# Patient Record
Sex: Female | Born: 1975 | Race: White | Hispanic: No | Marital: Married | Smoking: Never smoker
Health system: Southern US, Community
[De-identification: ages and names within clinical notes are randomized; demographics above are authoritative.]

## PROBLEM LIST (undated history)

## (undated) ENCOUNTER — Inpatient Hospital Stay (HOSPITAL_COMMUNITY): Payer: Self-pay

## (undated) DIAGNOSIS — Z789 Other specified health status: Secondary | ICD-10-CM

## (undated) DIAGNOSIS — F419 Anxiety disorder, unspecified: Secondary | ICD-10-CM

## (undated) HISTORY — PX: NO PAST SURGERIES: SHX2092

## (undated) HISTORY — PX: BREAST ENHANCEMENT SURGERY: SHX7

## (undated) HISTORY — PX: WISDOM TOOTH EXTRACTION: SHX21

---

## 2003-05-25 ENCOUNTER — Other Ambulatory Visit: Admission: RE | Admit: 2003-05-25 | Discharge: 2003-05-25 | Payer: Self-pay | Admitting: Obstetrics and Gynecology

## 2004-08-19 ENCOUNTER — Other Ambulatory Visit: Admission: RE | Admit: 2004-08-19 | Discharge: 2004-08-19 | Payer: Self-pay | Admitting: Obstetrics and Gynecology

## 2005-09-01 ENCOUNTER — Other Ambulatory Visit: Admission: RE | Admit: 2005-09-01 | Discharge: 2005-09-01 | Payer: Self-pay | Admitting: Obstetrics and Gynecology

## 2006-09-14 ENCOUNTER — Ambulatory Visit (HOSPITAL_COMMUNITY): Admission: RE | Admit: 2006-09-14 | Discharge: 2006-09-14 | Payer: Self-pay | Admitting: Obstetrics and Gynecology

## 2006-10-28 ENCOUNTER — Ambulatory Visit (HOSPITAL_COMMUNITY): Admission: RE | Admit: 2006-10-28 | Discharge: 2006-10-28 | Payer: Self-pay | Admitting: Obstetrics and Gynecology

## 2007-01-31 ENCOUNTER — Inpatient Hospital Stay (HOSPITAL_COMMUNITY): Admission: AD | Admit: 2007-01-31 | Discharge: 2007-02-03 | Payer: Self-pay | Admitting: Obstetrics and Gynecology

## 2007-11-17 IMAGING — US US OB DETAIL+14 WK
1 series · 14 of 28 positions shown · non-contrast
Comparison: none

OBSTETRICAL ULTRASOUND:
 This ultrasound was performed in The [HOSPITAL], and the [facility] report will be stored to [REDACTED] PACS.

[Series 1: us ob detail+14 wk · 14 of 103 slices shown]
[im 4/103]
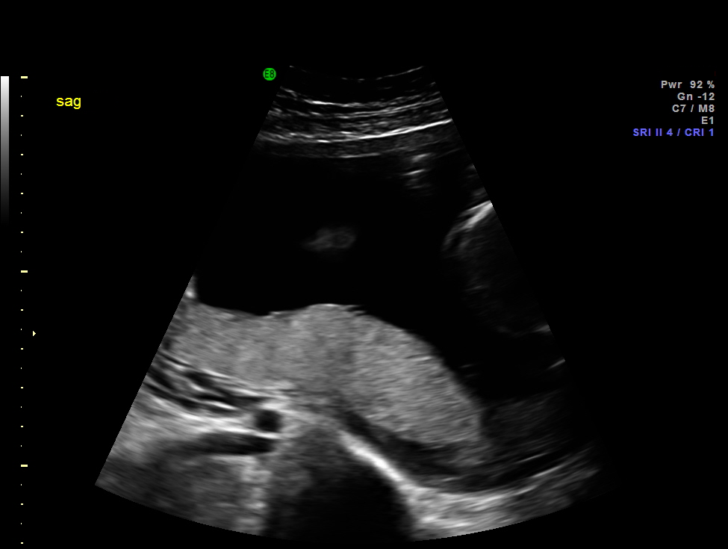
[im 12/103]
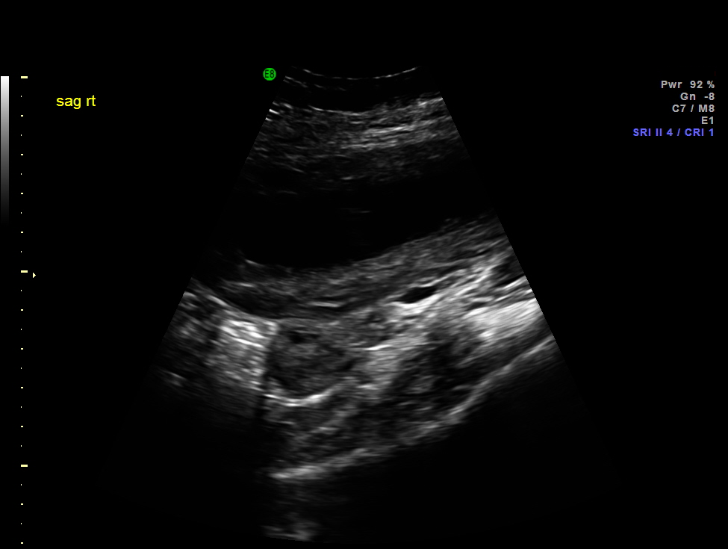
[im 19/103]
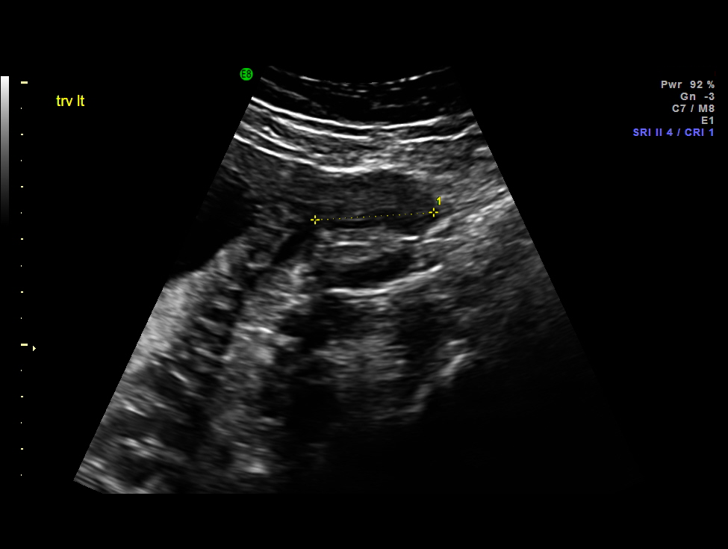
[im 27/103]
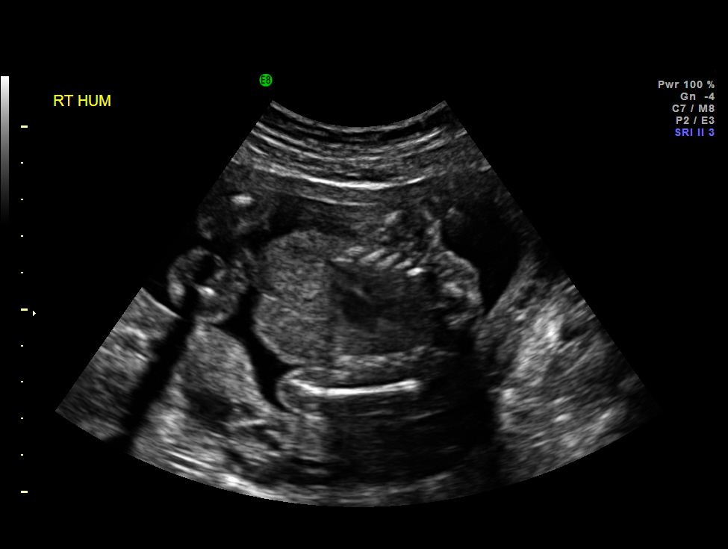
[im 35/103]
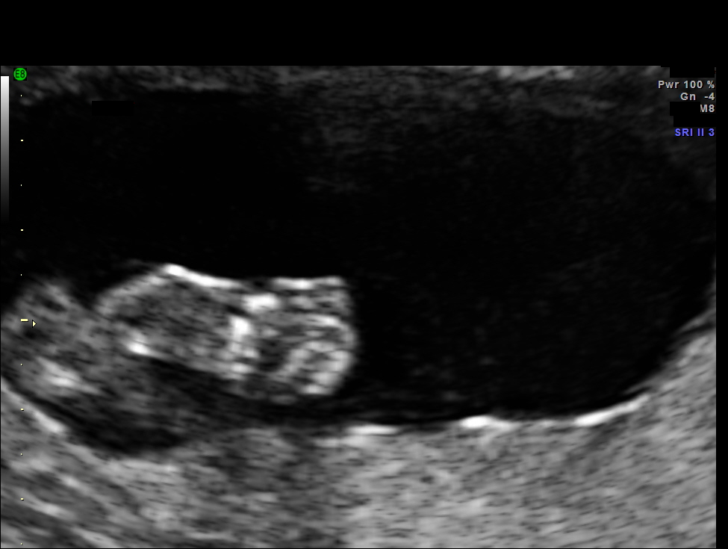
[im 42/103]
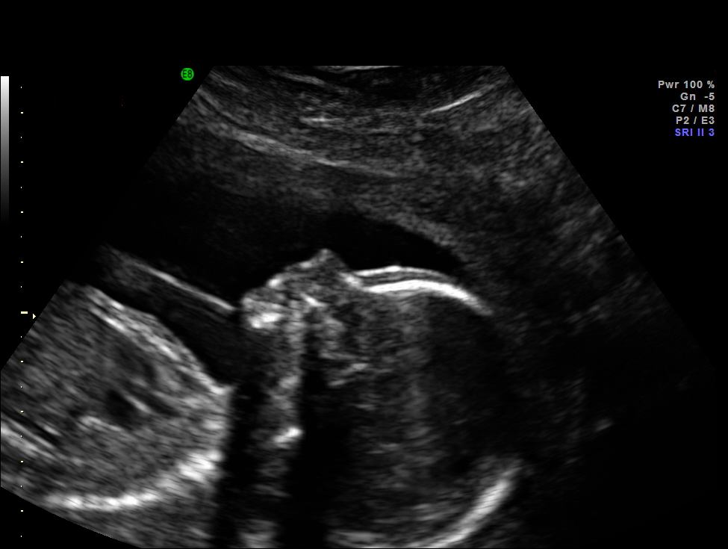
[im 50/103]
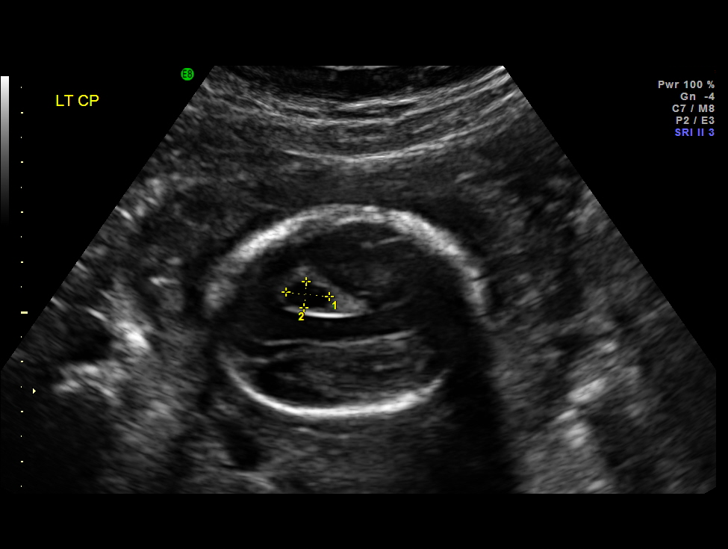
[im 57/103]
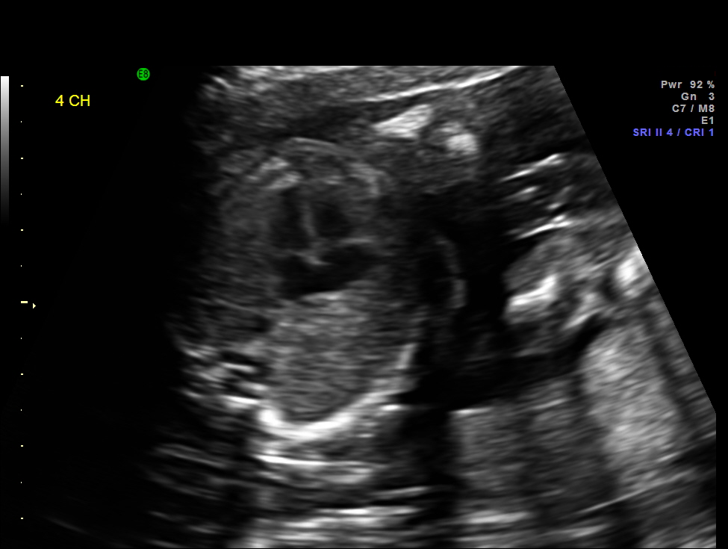
[im 65/103]
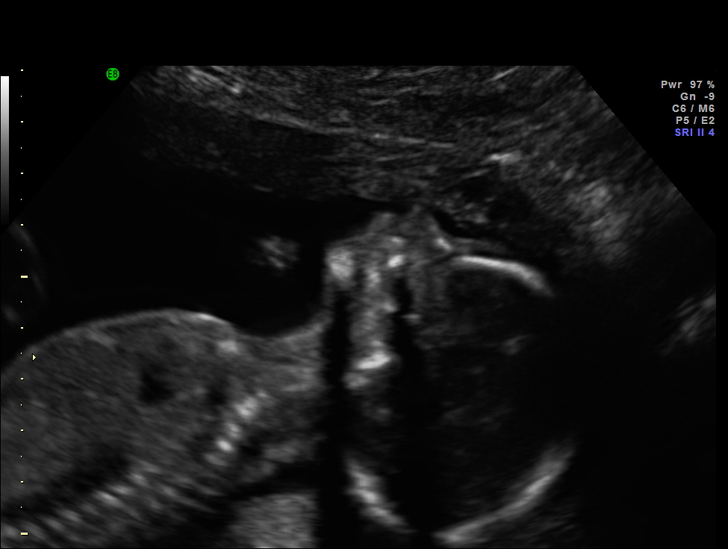
[im 72/103]
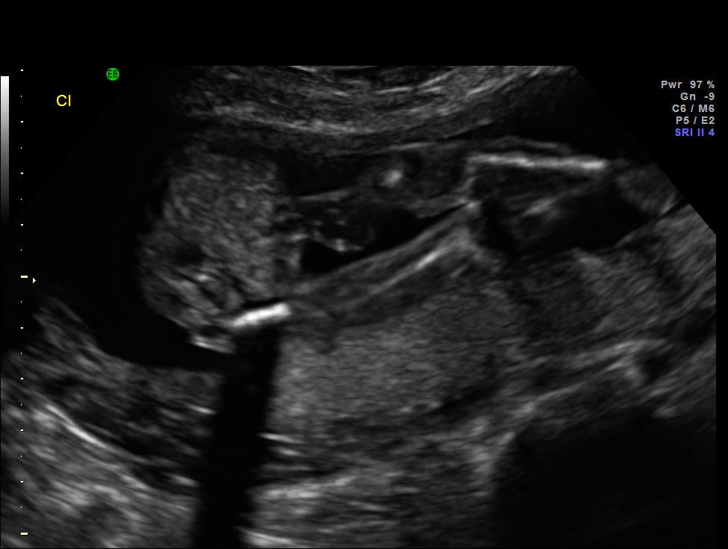
[im 80/103]
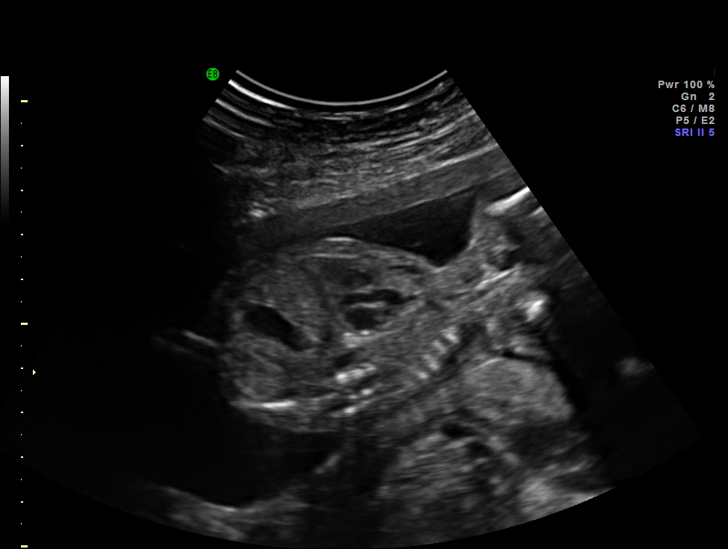
[im 87/103]
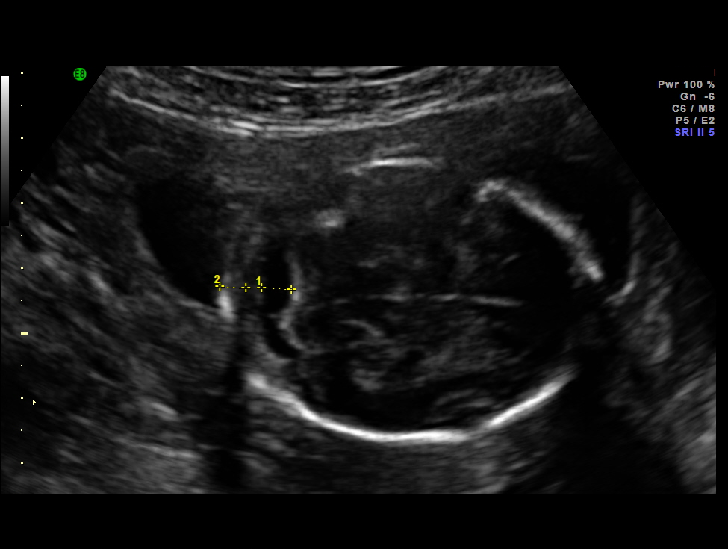
[im 95/103]
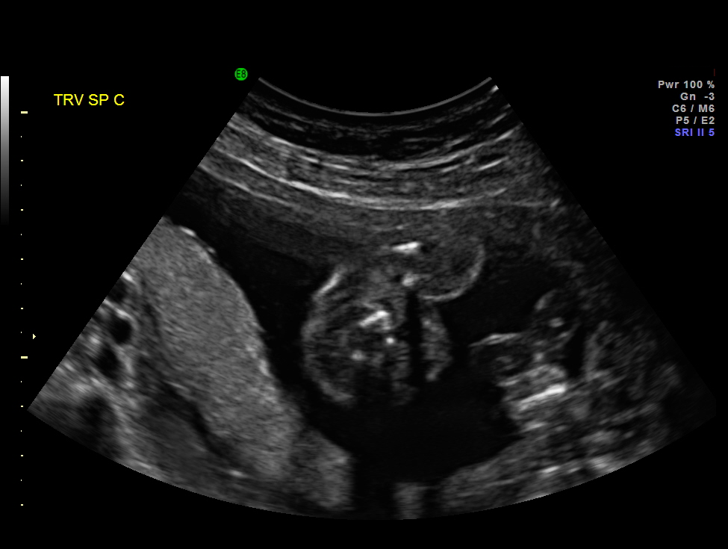
[im 103/103]
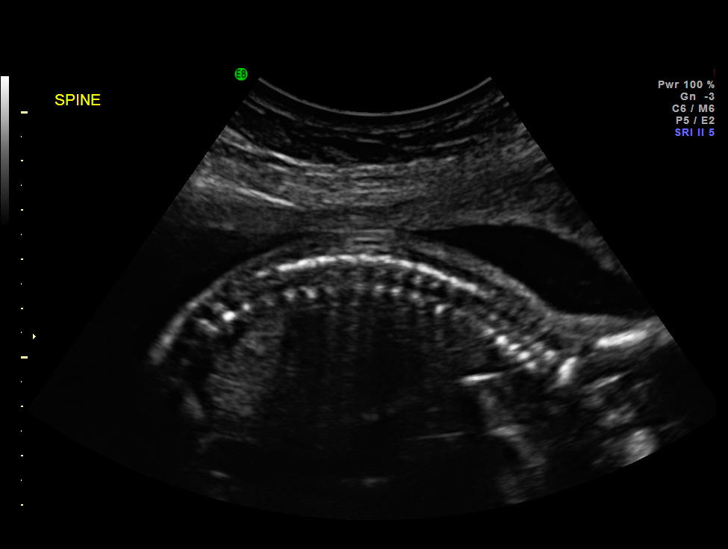

[14 of 28 positions shown; findings below may reference images not displayed]

IMPRESSION: The [facility] report has also been faxed to the ordering physician.

## 2010-03-09 ENCOUNTER — Inpatient Hospital Stay (HOSPITAL_COMMUNITY): Admission: AD | Admit: 2010-03-09 | Discharge: 2010-03-09 | Payer: Self-pay | Admitting: Obstetrics and Gynecology

## 2010-03-10 ENCOUNTER — Inpatient Hospital Stay (HOSPITAL_COMMUNITY): Admission: AD | Admit: 2010-03-10 | Discharge: 2010-03-10 | Payer: Self-pay | Admitting: Obstetrics and Gynecology

## 2010-03-11 ENCOUNTER — Inpatient Hospital Stay (HOSPITAL_COMMUNITY): Admission: RE | Admit: 2010-03-11 | Discharge: 2010-03-13 | Payer: Self-pay | Admitting: Obstetrics and Gynecology

## 2010-12-14 LAB — CBC
MCHC: 34.6 g/dL (ref 30.0–36.0)
RBC: 3.59 MIL/uL — ABNORMAL LOW (ref 3.87–5.11)
RDW: 14.4 % (ref 11.5–15.5)

## 2010-12-14 LAB — RH IMMUNE GLOB WKUP(>/=20WKS)(NOT WOMEN'S HOSP): Fetal Screen: NEGATIVE

## 2010-12-15 LAB — CBC
HCT: 32.7 % — ABNORMAL LOW (ref 36.0–46.0)
Hemoglobin: 11.5 g/dL — ABNORMAL LOW (ref 12.0–15.0)
MCHC: 35.1 g/dL (ref 30.0–36.0)
RDW: 14 % (ref 11.5–15.5)

## 2012-12-01 ENCOUNTER — Ambulatory Visit
Admission: RE | Admit: 2012-12-01 | Discharge: 2012-12-01 | Disposition: A | Discharge status: Home / Self Care | Payer: Managed Care, Other (non HMO) | Source: Ambulatory Visit | Attending: Family Medicine | Admitting: Family Medicine

## 2012-12-01 ENCOUNTER — Other Ambulatory Visit: Payer: Self-pay | Admitting: Family Medicine

## 2014-02-03 IMAGING — CR DG HIP COMPLETE 2+V*R*
2 series · 2 of 2 positions shown · non-contrast
Comparison: None.

CLINICAL DATA: Right hip pain for 3 weeks

RIGHT HIP - COMPLETE 2+ VIEW

[view not recorded (1 of 2)]
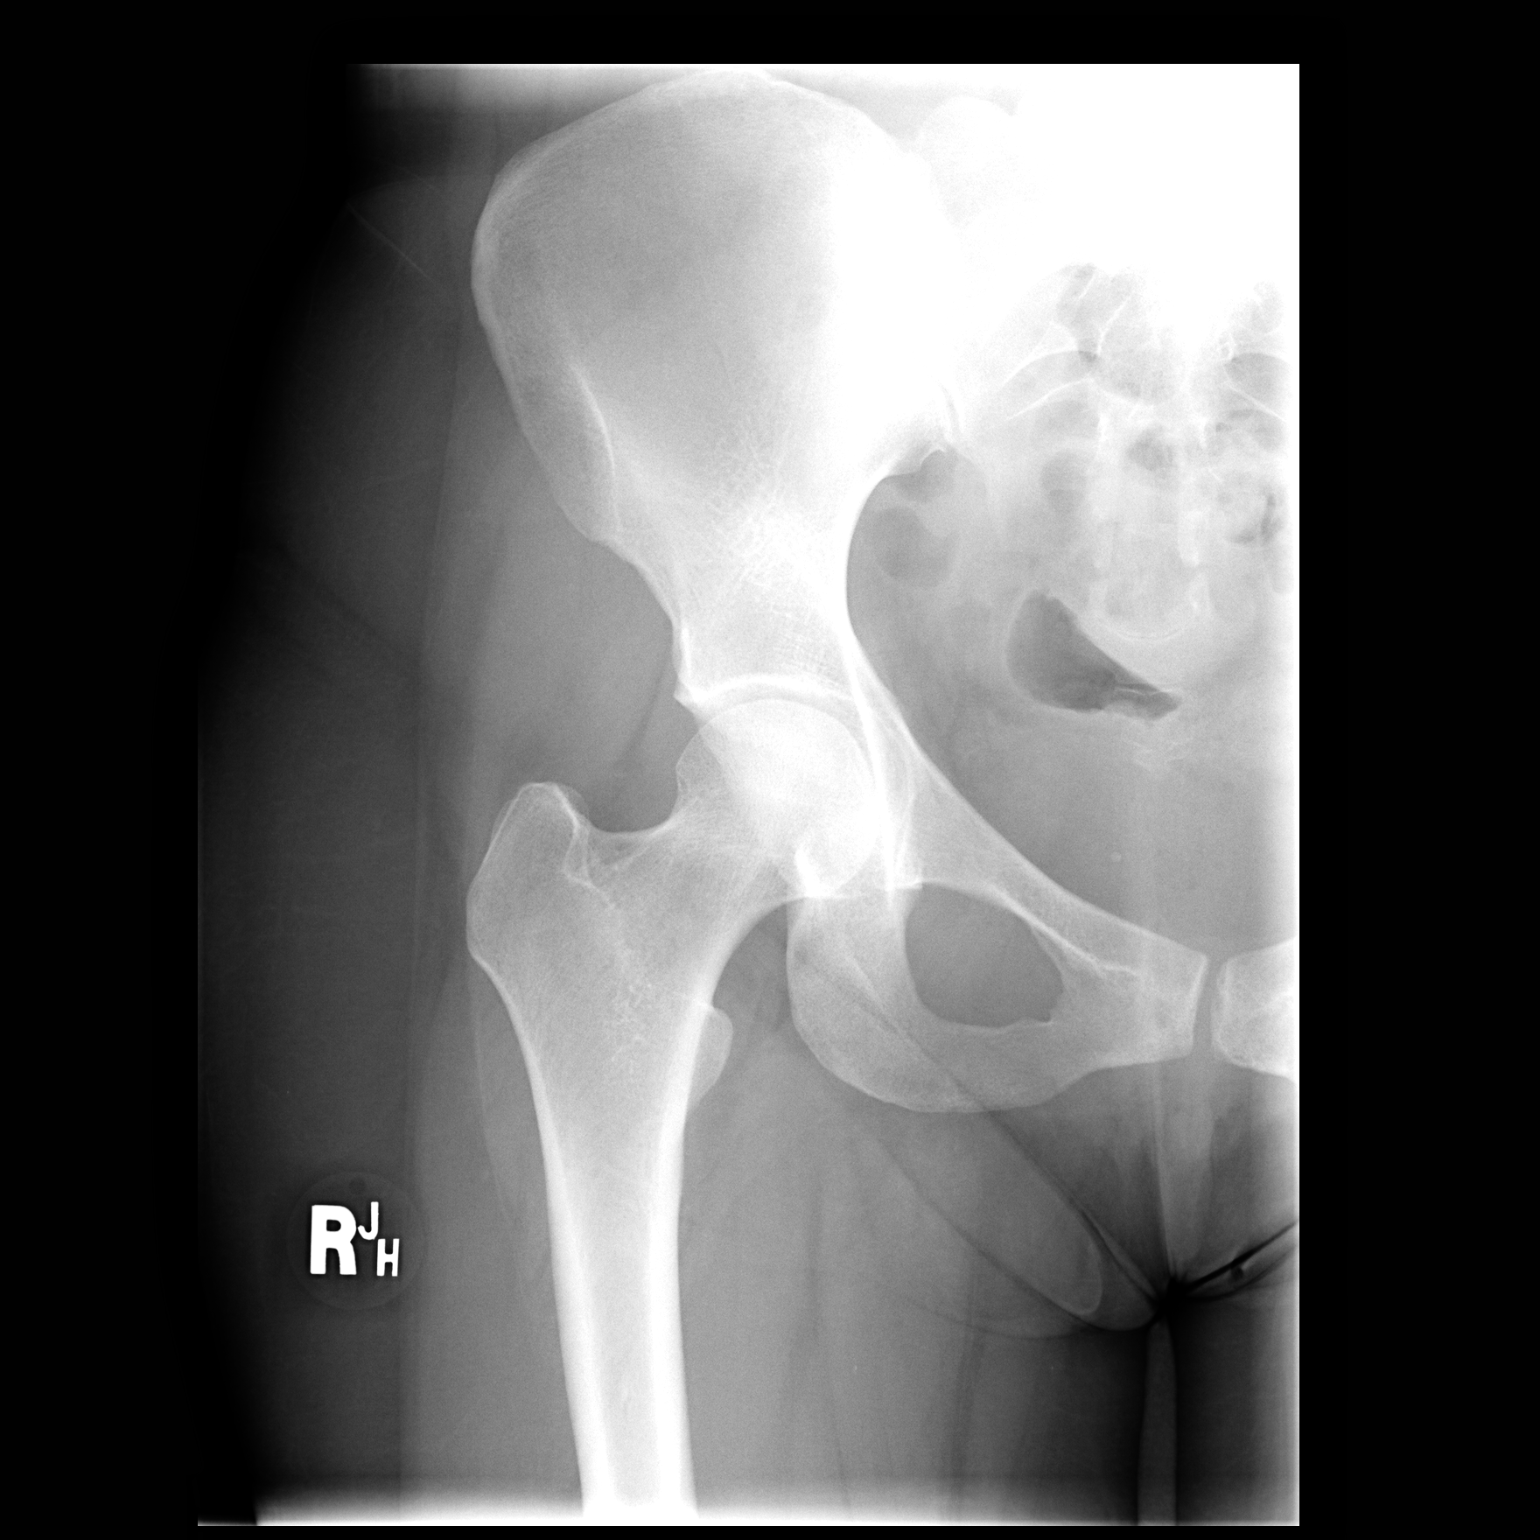

[view not recorded (2 of 2)]
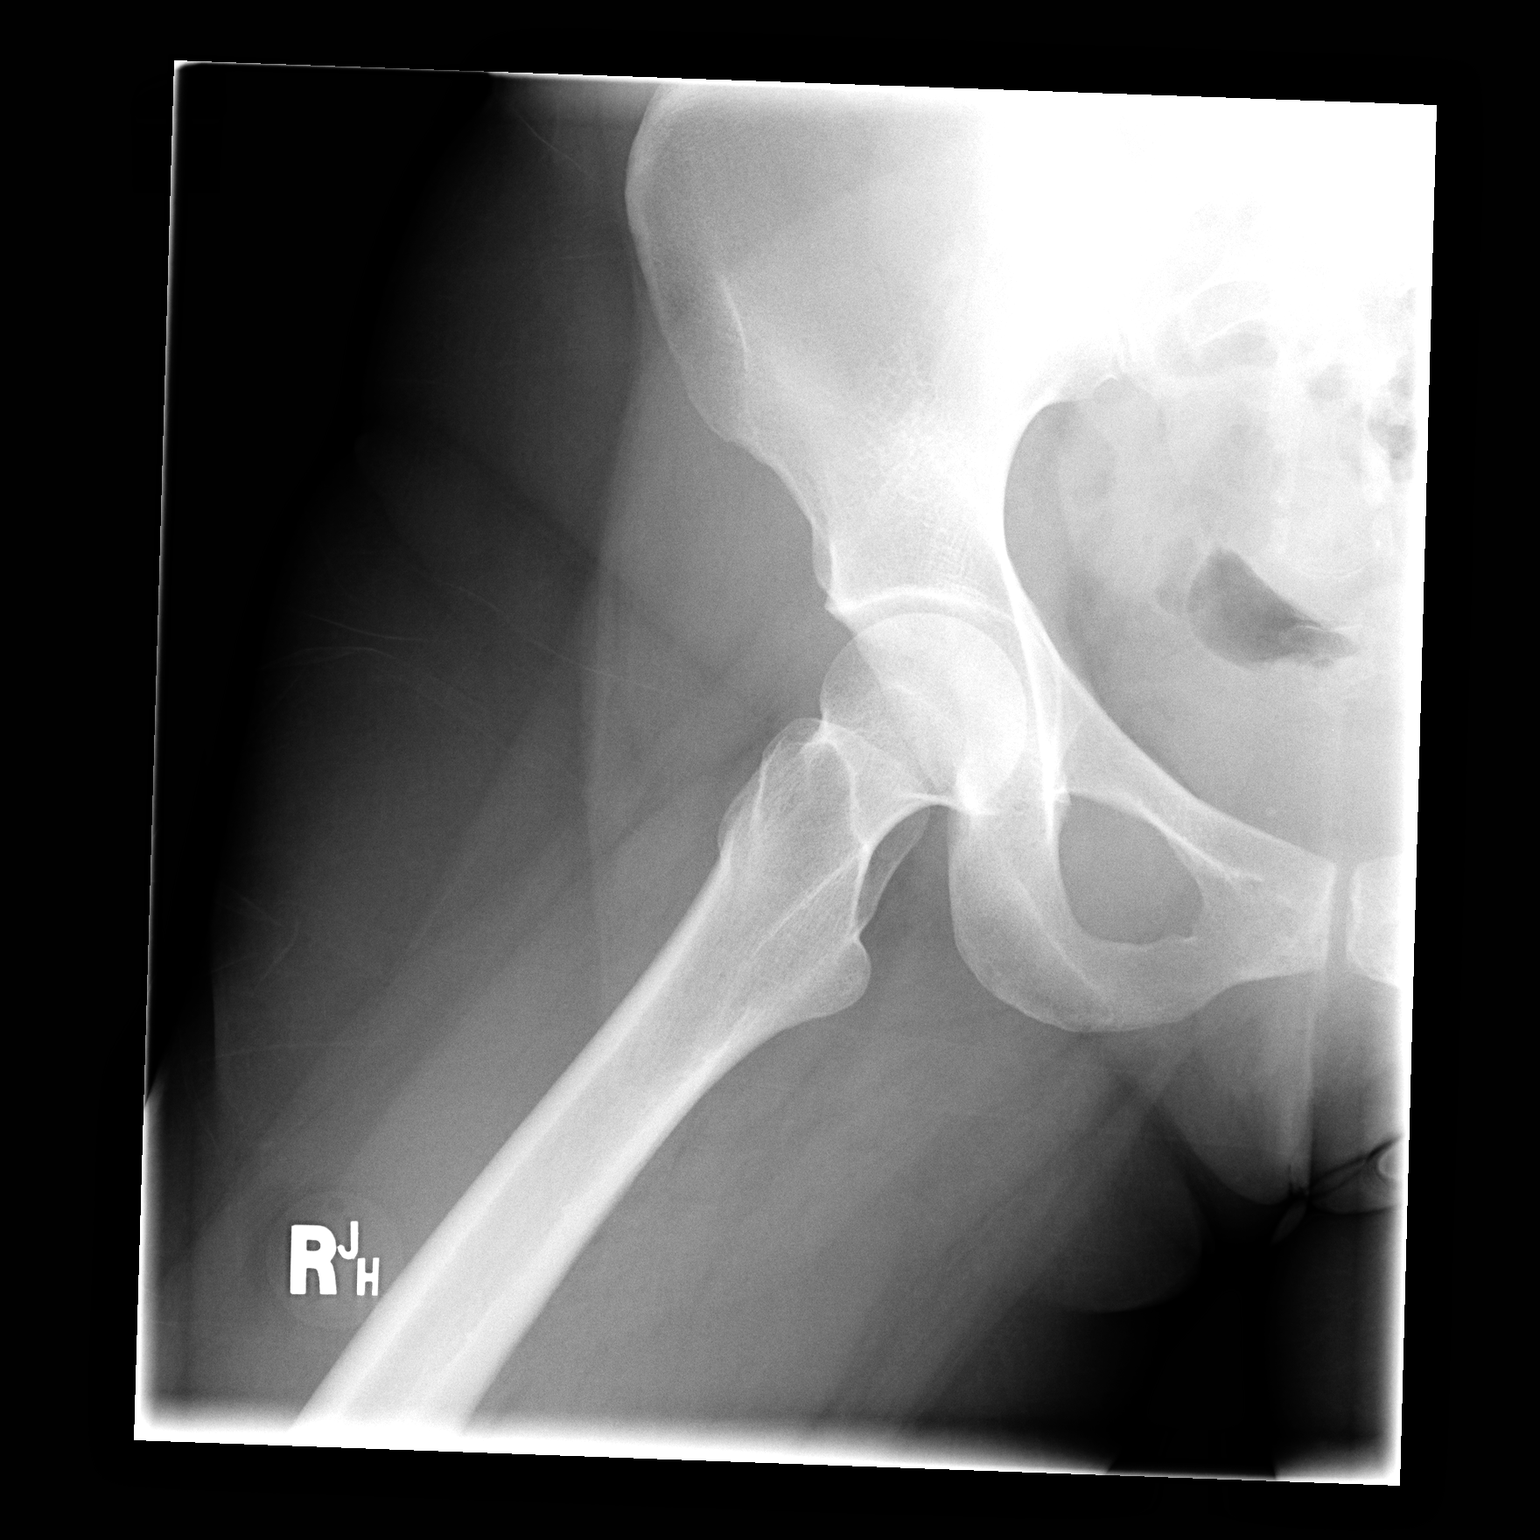

[2 of 2 positions shown; findings below may reference images not displayed]

FINDINGS: Two views the right hip show normal hip joint space.  No
fracture is seen.  The right ramus is intact.  The right SI joint
appears corticated.
IMPRESSION: Negative.

## 2014-07-11 LAB — OB RESULTS CONSOLE ABO/RH: RH Type: NEGATIVE

## 2014-07-11 LAB — OB RESULTS CONSOLE HEPATITIS B SURFACE ANTIGEN: Hepatitis B Surface Ag: NEGATIVE

## 2014-07-11 LAB — OB RESULTS CONSOLE GC/CHLAMYDIA
Chlamydia: NEGATIVE
Gonorrhea: NEGATIVE

## 2014-07-11 LAB — OB RESULTS CONSOLE ANTIBODY SCREEN: ANTIBODY SCREEN: NEGATIVE

## 2014-07-11 LAB — OB RESULTS CONSOLE HIV ANTIBODY (ROUTINE TESTING): HIV: NONREACTIVE

## 2014-07-11 LAB — OB RESULTS CONSOLE RUBELLA ANTIBODY, IGM: Rubella: NON-IMMUNE/NOT IMMUNE

## 2014-07-11 LAB — OB RESULTS CONSOLE RPR: RPR: NONREACTIVE

## 2014-09-28 NOTE — L&D Delivery Note (Signed)
Delivery Note At   a viable female was delivered via NSVD OA Presentation Apgars 9 9 weight pending Placenta status:spontaneously with 3 vessel cord , .  Cord:  with the following complications:tight nuchal cord x 1 .  Cord pH: not obtained  Anesthesia:  epidural Episiotomy: none Lacerations: none  Suture Repair: not applicable Est. Blood Loss (mL):  200  Mom to postpartum.  Baby to Couplet care / Skin to Skin.  Arriona Prest L 02/04/2015, 3:11 AM

## 2014-12-28 ENCOUNTER — Encounter (HOSPITAL_COMMUNITY): Payer: Self-pay

## 2014-12-28 ENCOUNTER — Inpatient Hospital Stay (HOSPITAL_COMMUNITY)
Admission: AD | Admit: 2014-12-28 | Discharge: 2014-12-28 | Disposition: A | Discharge status: Home / Self Care | Payer: Managed Care, Other (non HMO) | Source: Ambulatory Visit | Attending: Obstetrics and Gynecology | Admitting: Obstetrics and Gynecology

## 2014-12-28 DIAGNOSIS — O479 False labor, unspecified: Secondary | ICD-10-CM | POA: Diagnosis not present

## 2014-12-28 DIAGNOSIS — Z3A33 33 weeks gestation of pregnancy: Secondary | ICD-10-CM | POA: Diagnosis not present

## 2014-12-28 HISTORY — DX: Other specified health status: Z78.9

## 2014-12-28 LAB — URINE MICROSCOPIC-ADD ON

## 2014-12-28 LAB — URINALYSIS, ROUTINE W REFLEX MICROSCOPIC
Bilirubin Urine: NEGATIVE
Glucose, UA: NEGATIVE mg/dL
Ketones, ur: 15 mg/dL — AB
Leukocytes, UA: NEGATIVE
NITRITE: NEGATIVE
PROTEIN: NEGATIVE mg/dL
SPECIFIC GRAVITY, URINE: 1.01 (ref 1.005–1.030)
UROBILINOGEN UA: 0.2 mg/dL (ref 0.0–1.0)
pH: 7 (ref 5.0–8.0)

## 2014-12-28 MED ORDER — TERBUTALINE SULFATE 1 MG/ML IJ SOLN
0.2500 mg | Freq: Once | INTRAMUSCULAR | Status: AC
  Administered 2014-12-28: 0.25 mg via SUBCUTANEOUS
  Filled 2014-12-28: qty 1

## 2014-12-28 NOTE — Discharge Instructions (Signed)
Braxton Hicks Contractions °Contractions of the uterus can occur throughout pregnancy. Contractions are not always a sign that you are in labor.  °WHAT ARE BRAXTON HICKS CONTRACTIONS?  °Contractions that occur before labor are called Braxton Hicks contractions, or false labor. Toward the end of pregnancy (32-34 weeks), these contractions can develop more often and may become more forceful. This is not true labor because these contractions do not result in opening (dilatation) and thinning of the cervix. They are sometimes difficult to tell apart from true labor because these contractions can be forceful and people have different pain tolerances. You should not feel embarrassed if you go to the hospital with false labor. Sometimes, the only way to tell if you are in true labor is for your health care provider to look for changes in the cervix. °If there are no prenatal problems or other health problems associated with the pregnancy, it is completely safe to be sent home with false labor and await the onset of true labor. °HOW CAN YOU TELL THE DIFFERENCE BETWEEN TRUE AND FALSE LABOR? °False Labor °· The contractions of false labor are usually shorter and not as hard as those of true labor.   °· The contractions are usually irregular.   °· The contractions are often felt in the front of the lower abdomen and in the groin.   °· The contractions may go away when you walk around or change positions while lying down.   °· The contractions get weaker and are shorter lasting as time goes on.   °· The contractions do not usually become progressively stronger, regular, and closer together as with true labor.   °True Labor °· Contractions in true labor last 30-70 seconds, become very regular, usually become more intense, and increase in frequency.   °· The contractions do not go away with walking.   °· The discomfort is usually felt in the top of the uterus and spreads to the lower abdomen and low back.   °· True labor can be  determined by your health care provider with an exam. This will show that the cervix is dilating and getting thinner.   °WHAT TO REMEMBER °· Keep up with your usual exercises and follow other instructions given by your health care provider.   °· Take medicines as directed by your health care provider.   °· Keep your regular prenatal appointments.   °· Eat and drink lightly if you think you are going into labor.   °· If Braxton Hicks contractions are making you uncomfortable:   °¨ Change your position from lying down or resting to walking, or from walking to resting.   °¨ Sit and rest in a tub of warm water.   °¨ Drink 2-3 glasses of water. Dehydration may cause these contractions.   °¨ Do slow and deep breathing several times an hour.   °WHEN SHOULD I SEEK IMMEDIATE MEDICAL CARE? °Seek immediate medical care if: °· Your contractions become stronger, more regular, and closer together.   °· You have fluid leaking or gushing from your vagina.   °· You have a fever.   °· You pass blood-tinged mucus.   °· You have vaginal bleeding.   °· You have continuous abdominal pain.   °· You have low back pain that you never had before.   °· You feel your baby's head pushing down and causing pelvic pressure.   °· Your baby is not moving as much as it used to.   °Document Released: 09/14/2005 Document Revised: 09/19/2013 Document Reviewed: 06/26/2013 °ExitCare® Patient Information ©2015 [employer]. This information is not intended to replace advice given to you by your health care   provider. Make sure you discuss any questions you have with your health care provider. ° °

## 2014-12-28 NOTE — MAU Provider Note (Signed)
  History     CSN: [id number]  Arrival date and time: 12/28/14 1635   First Provider Initiated Contact with Patient 12/28/14 1840      Chief Complaint  Patient presents with  . Contractions   HPI Kristy Charles 39 y.o. G3P2 @ [id number] reports after noticing a nickel size amt of brown mucus-like discharge when she wiped earlier today after using the bathroom.  She denies contractions, LOF, vaginal bleeding, dysuria, weakness, fever, headache.  She is feeling pressure.     OB History    Gravida Para Term Preterm AB TAB SAB Ectopic Multiple Living   1               No past medical history on file.  No past surgical history on file.  No family history on file.  History  Substance Use Topics  . Smoking status: Not on file  . Smokeless tobacco: Not on file  . Alcohol Use: Not on file    Allergies:  Allergies  Allergen Reactions  . Augmentin [Amoxicillin-Pot Clavulanate] Hives    Prescriptions prior to admission  Medication Sig Dispense Refill Last Dose  . OVER THE COUNTER MEDICATION Take 2 tablets by mouth as needed (fiber tabs as needed for constipation).   Past Week at Unknown time  . Prenatal Vit-Fe Fumarate-FA (PRENATAL MULTIVITAMIN) TABS tablet Take 1 tablet by mouth daily at 12 noon.   12/27/2014 at Unknown time    ROS Pertinent ROS in HPI  Physical Exam   There were no vitals taken for this visit.  Physical Exam  Constitutional: She is oriented to person, place, and time. She appears well-developed and well-nourished. No distress.  HENT:  Head: Normocephalic and atraumatic.  Eyes: EOM are normal.  Neck: Normal range of motion.  Cardiovascular: Normal rate and regular rhythm.   Respiratory: Effort normal and breath sounds normal. No respiratory distress.  GI: Soft. Bowel sounds are normal. There is no tenderness.  Genitourinary:  Small amt of homogenous discharge with tan-colored tent.   Cervix is 1cm, long.  No tenderness  Musculoskeletal: Normal range  of motion.  Neurological: She is alert and oriented to person, place, and time.  Skin: Skin is warm and dry.  Psychiatric: She has a normal mood and affect.   Fetal Tracing: Baseline:150 Variability:mod Accelerations: 15x15 Decelerations:none Toco:irreg q 4-6   MAU Course  Procedures Pt encouraged to push po fluids.  Cervix rechecked after one hour and no change detected.   Despite good po fluid intake, no improvement in ctxs.  She also now notes feeling the contractions.    MDM Dr. Marcelle OverlieHolland consulted.  He advises for Terbutyline 0.25mg  SQ x 1.  Following this, pt may be discharge to home with relaxing weekend.  Any contractions - return to MAU.  Report given and care turned over to Cathie BeamsFran Cresenzo-Dishmon, CNM.    Nada MaclachlanKaren Teague Clark, PA-C 9:26pm  Assessment and Plan    Pt had adequate response to terbutaline and feels "much better".  Pt discharged home

## 2015-01-10 LAB — OB RESULTS CONSOLE GBS: STREP GROUP B AG: NEGATIVE

## 2015-02-03 ENCOUNTER — Inpatient Hospital Stay (HOSPITAL_COMMUNITY)
Admission: AD | Admit: 2015-02-03 | Discharge: 2015-02-05 | DRG: 775 | Disposition: A | Discharge status: Home / Self Care | Payer: Managed Care, Other (non HMO) | Source: Ambulatory Visit | Attending: Obstetrics and Gynecology | Admitting: Obstetrics and Gynecology

## 2015-02-03 ENCOUNTER — Encounter (HOSPITAL_COMMUNITY): Payer: Self-pay | Admitting: *Deleted

## 2015-02-03 ENCOUNTER — Inpatient Hospital Stay (HOSPITAL_COMMUNITY): Payer: Managed Care, Other (non HMO) | Admitting: Anesthesiology

## 2015-02-03 DIAGNOSIS — O4292 Full-term premature rupture of membranes, unspecified as to length of time between rupture and onset of labor: Principal | ICD-10-CM | POA: Diagnosis present

## 2015-02-03 DIAGNOSIS — F410 Panic disorder [episodic paroxysmal anxiety] without agoraphobia: Secondary | ICD-10-CM | POA: Diagnosis present

## 2015-02-03 DIAGNOSIS — Z23 Encounter for immunization: Secondary | ICD-10-CM

## 2015-02-03 DIAGNOSIS — O99344 Other mental disorders complicating childbirth: Secondary | ICD-10-CM | POA: Diagnosis present

## 2015-02-03 DIAGNOSIS — Z3A39 39 weeks gestation of pregnancy: Secondary | ICD-10-CM | POA: Diagnosis present

## 2015-02-03 HISTORY — DX: Anxiety disorder, unspecified: F41.9

## 2015-02-03 LAB — CBC
HCT: 35.4 % — ABNORMAL LOW (ref 36.0–46.0)
Hemoglobin: 12.1 g/dL (ref 12.0–15.0)
MCH: 30.5 pg (ref 26.0–34.0)
MCHC: 34.2 g/dL (ref 30.0–36.0)
MCV: 89.2 fL (ref 78.0–100.0)
PLATELETS: 185 10*3/uL (ref 150–400)
RBC: 3.97 MIL/uL (ref 3.87–5.11)
RDW: 13.6 % (ref 11.5–15.5)
WBC: 12.3 10*3/uL — ABNORMAL HIGH (ref 4.0–10.5)

## 2015-02-03 LAB — POCT FERN TEST

## 2015-02-03 MED ORDER — OXYTOCIN BOLUS FROM INFUSION
500.0000 mL | INTRAVENOUS | Status: DC
  Administered 2015-02-04: 500 mL via INTRAVENOUS

## 2015-02-03 MED ORDER — TERBUTALINE SULFATE 1 MG/ML IJ SOLN: 0.2500 mg | Freq: Once | INTRAMUSCULAR | Status: AC | PRN

## 2015-02-03 MED ORDER — FENTANYL 2.5 MCG/ML BUPIVACAINE 1/10 % EPIDURAL INFUSION (WH - ANES)
14.0000 mL/h | INTRAMUSCULAR | Status: DC | PRN
  Administered 2015-02-03: 12 mL/h via EPIDURAL
  Administered 2015-02-03: 14 mL/h via EPIDURAL
  Filled 2015-02-03: qty 125

## 2015-02-03 MED ORDER — LIDOCAINE HCL (PF) 1 % IJ SOLN
30.0000 mL | INTRAMUSCULAR | Status: DC | PRN
  Filled 2015-02-03: qty 30

## 2015-02-03 MED ORDER — DIPHENHYDRAMINE HCL 50 MG/ML IJ SOLN: 12.5000 mg | INTRAMUSCULAR | Status: DC | PRN

## 2015-02-03 MED ORDER — ONDANSETRON HCL 4 MG/2ML IJ SOLN: 4.0000 mg | Freq: Four times a day (QID) | INTRAMUSCULAR | Status: DC | PRN

## 2015-02-03 MED ORDER — OXYTOCIN 40 UNITS IN LACTATED RINGERS INFUSION - SIMPLE MED
1.0000 m[IU]/min | INTRAVENOUS | Status: DC
  Administered 2015-02-03: 2 m[IU]/min via INTRAVENOUS

## 2015-02-03 MED ORDER — PHENYLEPHRINE 40 MCG/ML (10ML) SYRINGE FOR IV PUSH (FOR BLOOD PRESSURE SUPPORT)
80.0000 ug | PREFILLED_SYRINGE | INTRAVENOUS | Status: DC | PRN
  Filled 2015-02-03: qty 2
  Filled 2015-02-03: qty 20

## 2015-02-03 MED ORDER — FLEET ENEMA 7-19 GM/118ML RE ENEM: 1.0000 | ENEMA | RECTAL | Status: DC | PRN

## 2015-02-03 MED ORDER — ACETAMINOPHEN 325 MG PO TABS: 650.0000 mg | ORAL_TABLET | ORAL | Status: DC | PRN

## 2015-02-03 MED ORDER — OXYCODONE-ACETAMINOPHEN 5-325 MG PO TABS: 1.0000 | ORAL_TABLET | ORAL | Status: DC | PRN

## 2015-02-03 MED ORDER — LACTATED RINGERS IV SOLN
INTRAVENOUS | Status: DC
  Administered 2015-02-03: 500 mL via INTRAVENOUS
  Administered 2015-02-03 – 2015-02-04 (×2): via INTRAVENOUS

## 2015-02-03 MED ORDER — EPHEDRINE 5 MG/ML INJ
10.0000 mg | INTRAVENOUS | Status: DC | PRN
  Filled 2015-02-03: qty 2

## 2015-02-03 MED ORDER — OXYCODONE-ACETAMINOPHEN 5-325 MG PO TABS: 2.0000 | ORAL_TABLET | ORAL | Status: DC | PRN

## 2015-02-03 MED ORDER — LIDOCAINE-EPINEPHRINE (PF) 2 %-1:200000 IJ SOLN
INTRAMUSCULAR | Status: DC | PRN
  Administered 2015-02-03: 3 mL

## 2015-02-03 MED ORDER — LACTATED RINGERS IV SOLN
500.0000 mL | INTRAVENOUS | Status: DC | PRN
  Administered 2015-02-04: 1000 mL via INTRAVENOUS

## 2015-02-03 MED ORDER — BUPIVACAINE HCL (PF) 0.25 % IJ SOLN
INTRAMUSCULAR | Status: DC | PRN
  Administered 2015-02-03 (×2): 3 mL

## 2015-02-03 MED ORDER — OXYTOCIN 40 UNITS IN LACTATED RINGERS INFUSION - SIMPLE MED
62.5000 mL/h | INTRAVENOUS | Status: DC
  Filled 2015-02-03: qty 1000

## 2015-02-03 MED ORDER — CITRIC ACID-SODIUM CITRATE 334-500 MG/5ML PO SOLN: 30.0000 mL | ORAL | Status: DC | PRN

## 2015-02-03 NOTE — Anesthesia Preprocedure Evaluation (Signed)
Anesthesia Evaluation  Patient identified by MRN, date of birth, ID band Patient awake    Reviewed: Allergy & Precautions, NPO status , Patient's Chart, lab work & pertinent test results  History of Anesthesia Complications Negative for: history of anesthetic complications  Airway Mallampati: II  TM Distance: >3 FB Neck ROM: Full    Dental  (+) Teeth Intact   Pulmonary neg pulmonary ROS,  breath sounds clear to auscultation        Cardiovascular negative cardio ROS  Rhythm:Regular     Neuro/Psych PSYCHIATRIC DISORDERS Anxiety negative neurological ROS     GI/Hepatic negative GI ROS, Neg liver ROS,   Endo/Other  negative endocrine ROS  Renal/GU negative Renal ROS     Musculoskeletal   Abdominal   Peds  Hematology  (+) anemia ,   Anesthesia Other Findings   Reproductive/Obstetrics (+) Pregnancy                             Anesthesia Physical Anesthesia Plan  ASA: II  Anesthesia Plan: Epidural   Post-op Pain Management:    Induction:   Airway Management Planned:   Additional Equipment:   Intra-op Plan:   Post-operative Plan:   Informed Consent: I have reviewed the patients History and Physical, chart, labs and discussed the procedure including the risks, benefits and alternatives for the proposed anesthesia with the patient or authorized representative who has indicated his/her understanding and acceptance.     Plan Discussed with: Anesthesiologist  Anesthesia Plan Comments:         Anesthesia Quick Evaluation

## 2015-02-03 NOTE — Anesthesia Procedure Notes (Signed)
Epidural Patient location during procedure: OB  Staffing Anesthesiologist: Lyrica Mcclarty, CHRIS Performed by: anesthesiologist   Preanesthetic Checklist Completed: patient identified, surgical consent, pre-op evaluation, timeout performed, IV checked, risks and benefits discussed and monitors and equipment checked  Epidural Patient position: sitting Prep: site prepped and draped and DuraPrep Patient monitoring: heart rate, cardiac monitor, continuous pulse ox and blood pressure Approach: midline Location: L3-L4 Injection technique: LOR saline  Needle:  Needle type: Tuohy  Needle gauge: 17 G Needle length: 9 cm Needle insertion depth: 6 cm Catheter type: closed end flexible Catheter size: 19 Gauge Catheter at skin depth: 12 cm Test dose: negative and 2% lidocaine with Epi 1:200 K  Assessment Events: blood not aspirated, injection not painful, no injection resistance, negative IV test and no paresthesia  Additional Notes H+P and labs checked, risks and benefits discussed with the patient, consent obtained, procedure tolerated well and without complications.  Reason for block:procedure for pain   

## 2015-02-04 ENCOUNTER — Encounter (HOSPITAL_COMMUNITY): Payer: Self-pay | Admitting: General Practice

## 2015-02-04 LAB — CBC
HCT: 32.9 % — ABNORMAL LOW (ref 36.0–46.0)
Hemoglobin: 11.1 g/dL — ABNORMAL LOW (ref 12.0–15.0)
MCH: 30 pg (ref 26.0–34.0)
MCHC: 33.7 g/dL (ref 30.0–36.0)
MCV: 88.9 fL (ref 78.0–100.0)
Platelets: 165 10*3/uL (ref 150–400)
RBC: 3.7 MIL/uL — ABNORMAL LOW (ref 3.87–5.11)
RDW: 13.5 % (ref 11.5–15.5)
WBC: 18.6 10*3/uL — ABNORMAL HIGH (ref 4.0–10.5)

## 2015-02-04 LAB — RPR: RPR Ser Ql: NONREACTIVE

## 2015-02-04 MED ORDER — DIPHENHYDRAMINE HCL 25 MG PO CAPS: 25.0000 mg | ORAL_CAPSULE | Freq: Four times a day (QID) | ORAL | Status: DC | PRN

## 2015-02-04 MED ORDER — FLEET ENEMA 7-19 GM/118ML RE ENEM: 1.0000 | ENEMA | Freq: Every day | RECTAL | Status: DC | PRN

## 2015-02-04 MED ORDER — SENNOSIDES-DOCUSATE SODIUM 8.6-50 MG PO TABS
2.0000 | ORAL_TABLET | ORAL | Status: DC
Start: 2015-02-05 — End: 2015-02-05
  Administered 2015-02-04: 2 via ORAL
  Filled 2015-02-04: qty 2

## 2015-02-04 MED ORDER — MEDROXYPROGESTERONE ACETATE 150 MG/ML IM SUSP: 150.0000 mg | INTRAMUSCULAR | Status: DC | PRN

## 2015-02-04 MED ORDER — ZOLPIDEM TARTRATE 5 MG PO TABS: 5.0000 mg | ORAL_TABLET | Freq: Every evening | ORAL | Status: DC | PRN

## 2015-02-04 MED ORDER — TETANUS-DIPHTH-ACELL PERTUSSIS 5-2.5-18.5 LF-MCG/0.5 IM SUSP: 0.5000 mL | Freq: Once | INTRAMUSCULAR | Status: DC

## 2015-02-04 MED ORDER — ACETAMINOPHEN 325 MG PO TABS: 650.0000 mg | ORAL_TABLET | ORAL | Status: DC | PRN

## 2015-02-04 MED ORDER — SIMETHICONE 80 MG PO CHEW: 80.0000 mg | CHEWABLE_TABLET | ORAL | Status: DC | PRN

## 2015-02-04 MED ORDER — BISACODYL 10 MG RE SUPP: 10.0000 mg | Freq: Every day | RECTAL | Status: DC | PRN

## 2015-02-04 MED ORDER — LANOLIN HYDROUS EX OINT: TOPICAL_OINTMENT | CUTANEOUS | Status: DC | PRN

## 2015-02-04 MED ORDER — OXYCODONE-ACETAMINOPHEN 5-325 MG PO TABS: 1.0000 | ORAL_TABLET | ORAL | Status: DC | PRN

## 2015-02-04 MED ORDER — IBUPROFEN 600 MG PO TABS
600.0000 mg | ORAL_TABLET | Freq: Four times a day (QID) | ORAL | Status: DC
Start: 2015-02-04 — End: 2015-02-05
  Administered 2015-02-04 – 2015-02-05 (×5): 600 mg via ORAL
  Filled 2015-02-04 (×5): qty 1

## 2015-02-04 MED ORDER — BENZOCAINE-MENTHOL 20-0.5 % EX AERO
1.0000 "application " | INHALATION_SPRAY | CUTANEOUS | Status: DC | PRN
  Filled 2015-02-04: qty 56

## 2015-02-04 MED ORDER — PRENATAL MULTIVITAMIN CH
1.0000 | ORAL_TABLET | Freq: Every day | ORAL | Status: DC
  Administered 2015-02-04: 1 via ORAL
  Filled 2015-02-04: qty 1

## 2015-02-04 MED ORDER — WITCH HAZEL-GLYCERIN EX PADS: 1.0000 "application " | MEDICATED_PAD | CUTANEOUS | Status: DC | PRN

## 2015-02-04 MED ORDER — DIBUCAINE 1 % RE OINT: 1.0000 "application " | TOPICAL_OINTMENT | RECTAL | Status: DC | PRN

## 2015-02-04 MED ORDER — ONDANSETRON HCL 4 MG/2ML IJ SOLN: 4.0000 mg | INTRAMUSCULAR | Status: DC | PRN

## 2015-02-04 MED ORDER — ONDANSETRON HCL 4 MG PO TABS: 4.0000 mg | ORAL_TABLET | ORAL | Status: DC | PRN

## 2015-02-04 MED ORDER — OXYCODONE-ACETAMINOPHEN 5-325 MG PO TABS: 2.0000 | ORAL_TABLET | ORAL | Status: DC | PRN

## 2015-02-04 MED ORDER — MEASLES, MUMPS & RUBELLA VAC ~~LOC~~ INJ
0.5000 mL | INJECTION | Freq: Once | SUBCUTANEOUS | Status: AC
  Administered 2015-02-05: 0.5 mL via SUBCUTANEOUS
  Filled 2015-02-04 (×2): qty 0.5

## 2015-02-04 NOTE — H&P (Signed)
Kristy Charles is a 39 y.o. female presenting for SROM. Maternal Medical History:  Reason for admission: Rupture of membranes.   Contractions: Onset was 3-5 hours ago.   Frequency: regular.   Perceived severity is moderate.    Fetal activity: Perceived fetal activity is normal.      OB History    Gravida Para Term Preterm AB TAB SAB Ectopic Multiple Living   3 2 2       2      Past Medical History  Diagnosis Date  . Medical history non-contributory   . Anxiety     panic attack   Past Surgical History  Procedure Laterality Date  . No past surgeries    . Wisdom tooth extraction    . Breast enhancement surgery      18 months ago Jan 2015   Family History: family history is not on file. Social History:  reports that she has never smoked. She has never used smokeless tobacco. She reports that she drinks alcohol. She reports that she does not use illicit drugs.   Prenatal Transfer Tool  Maternal Diabetes: No Genetic Screening: Normal Maternal Ultrasounds/Referrals: Normal Fetal Ultrasounds or other Referrals:  None Maternal Substance Abuse:  No Significant Maternal Medications:  None Significant Maternal Lab Results:  None Other Comments:  None  Review of Systems  All other systems reviewed and are negative.   Dilation: Lip/rim Effacement (%): 100 Station: +1 Exam by:: Veronica Mensah Blood pressure 100/63, pulse 96, temperature 98.5 F (36.9 C), temperature source Oral, resp. rate 18, height 5\' 4"  (1.626 m), weight 76.658 kg (169 lb), SpO2 100 %. Maternal Exam:  Uterine Assessment: Contraction strength is moderate.  Contraction frequency is regular.   Abdomen: Fetal presentation: vertex     Fetal Exam Fetal State Assessment: Category I - tracings are normal.     Physical Exam  Nursing note and vitals reviewed. Constitutional: She appears well-developed.  HENT:  Head: Normocephalic.  Eyes: Pupils are equal, round, and reactive to light.  Neck:  Normal range of motion.  Cardiovascular: Normal rate and regular rhythm.   Respiratory: Effort normal.  GI: Soft.    Prenatal labs: ABO, Rh: --/--/O NEG (05/08 2123) Antibody: POS (05/08 2123) Rubella: Nonimmune (10/14 0000) RPR: Nonreactive (10/14 0000)  HBsAg: Negative (10/14 0000)  HIV: Non-reactive (10/14 0000)  GBS: Negative (04/14 0000)   Assessment/Plan: IUP at term SROM Anticipate NSVD  [name] L 02/04/2015, 1:49 AM

## 2015-02-04 NOTE — Lactation Note (Signed)
This note was copied from the chart of Kristy Rose Fillersllison Posadas. Lactation Consultation Note  P3, Ex BF for 8 months.  States she has hand expressed and seen colostrum. Mother had breast augmentation in 2015 with an inframammary incision (below breast). Discussed supply and demand and engorgement and directed her to bfar.org website. Encouraged her to breastfeed often with feeding cues. Reviewed cluster feeding, deep latch. Mom encouraged to feed baby 8-12 times/24 hours.  Mom made aware of O/P services, breastfeeding support groups, community resources, and our phone # for post-discharge questions.  Left LC phone number and encouraged her to call with next feeding.    Patient Name: Kristy Charles Today's Date: 02/04/2015 Reason for consult: Initial assessment   Maternal Data Has patient been taught Hand Expression?: Yes  Feeding Feeding Type: Breast Fed Length of feed: 10 min  LATCH Score/Interventions                      Lactation Tools Discussed/Used     Consult Status Consult Status: Follow-up Date: 02/05/15 Follow-up type: In-patient    Dahlia ByesBerkelhammer, Jaevin Medearis [facility] 02/04/2015, 11:53 AM

## 2015-02-04 NOTE — Anesthesia Postprocedure Evaluation (Signed)
  Anesthesia Post-op Note  Patient: Olena Hecklellison H Drew  Procedure(s) Performed: * No procedures listed *  Patient Location: Mother/Baby  Anesthesia Type:Epidural  Level of Consciousness: awake, alert , oriented and patient cooperative  Airway and Oxygen Therapy: Patient Spontanous Breathing  Post-op Pain: none  Post-op Assessment: Post-op Vital signs reviewed, Patient's Cardiovascular Status Stable, Respiratory Function Stable, Patent Airway, No headache, No backache, No residual numbness and No residual motor weakness  Post-op Vital Signs: Reviewed and stable  Last Vitals:  Filed Vitals:   02/04/15 0711  BP: 104/58  Pulse: 83  Temp: 36.9 C  Resp: 20    Complications: No apparent anesthesia complications

## 2015-02-04 NOTE — Progress Notes (Signed)
Post Partum Day 0 Subjective: no complaints, up ad lib and tolerating PO  Objective: Blood pressure 104/58, pulse 83, temperature 98.4 F (36.9 C), temperature source Oral, resp. rate 20, height 5\' 4"  (1.626 m), weight 169 lb (76.658 kg), SpO2 100 %, unknown if currently breastfeeding.  Physical Exam:  General: alert and cooperative Lochia: appropriate Uterine Fundus: firm Incision: perineum intact DVT Evaluation: No evidence of DVT seen on physical exam. Negative Homan's sign. No cords or calf tenderness. No significant calf/ankle edema.   Recent Labs  02/03/15 2030 02/04/15 0645  HGB 12.1 11.1*  HCT 35.4* 32.9*    Assessment/Plan: Plan for discharge tomorrow and Circumcision prior to discharge   LOS: 1 day   CURTIS,CAROL G 02/04/2015, 8:19 AM

## 2015-02-05 ENCOUNTER — Inpatient Hospital Stay (HOSPITAL_COMMUNITY): Admission: RE | Admit: 2015-02-05 | Payer: Managed Care, Other (non HMO) | Source: Ambulatory Visit

## 2015-02-05 NOTE — Discharge Summary (Signed)
Obstetric Discharge Summary Reason for Admission: rupture of membranes Prenatal Procedures: ultrasound Intrapartum Procedures: spontaneous vaginal delivery Postpartum Procedures: none Complications-Operative and Postpartum: none HEMOGLOBIN  Date Value Ref Range Status  02/04/2015 11.1* 12.0 - 15.0 g/dL Final   HCT  Date Value Ref Range Status  02/04/2015 32.9* 36.0 - 46.0 % Final    Physical Exam:  General: alert and cooperative Lochia: appropriate Uterine Fundus: firm Incision: perineum intact DVT Evaluation: No evidence of DVT seen on physical exam. Negative Homan's sign. No cords or calf tenderness. No significant calf/ankle edema.  Discharge Diagnoses: Term Pregnancy-delivered  Discharge Information: Date: 02/05/2015 Activity: pelvic rest Diet: routine Medications: PNV and Ibuprofen Condition: stable Instructions: refer to practice specific booklet Discharge to: home   Newborn Data: Live born female  Birth Weight: 8 lb 6.4 oz (3810 g) APGAR: 8, 9  Home with mother and circ prior to discharge.  Delvis Kau G 02/05/2015, 7:05 AM

## 2015-02-05 NOTE — Progress Notes (Signed)
CLINICAL SOCIAL WORK MATERNAL/CHILD NOTE  Patient Details  Name: Kristy Charles MRN: [id number] Date of Birth: 02/04/2013  Date:  02/05/2015  Clinical Social Worker Initiating Note:  Loleta BooksSarah Brette Cast, LCSW Date/ Time Initiated:  02/05/15/0930     Child's Name:  Kristy Charles   Legal Guardian:  Kristy Charles and Kristy Charles (parents)  Need for Interpreter:  None   Date of Referral:  02/04/15     Reason for Referral: History of anxiety and panic attacks  [facility]   Address:  [address] [place], [place] [address]  Phone number:  [phone]   Household Members:  MOB, FOB, and their three children (Ava, Hedy JacobHarrison Townes, and this infant)  Natural Supports (not living in the home):  Extended Family, Immediate Family   Professional Supports: None   Employment: Full-time   Type of Work: Airline pilotAccountant   Education:  N/A  Architectinancial Resources:  Media plannerrivate Insurance   Other Resources:  None reported  Cultural/Religious Considerations Which May Impact Care: None reported  Strengths:  Ability to meet basic needs , Home prepared for child , Pediatrician chosen    Risk Factors/Current Problems: 1)Mental Health Concerns: MOB presents with history of anxiety. She endorsed onset of anxiety/panic attacks March 2015. She shared belief that symptoms are only prior to giving presentations at work, and denied any other events that trigger panic attacks.   Cognitive State:  Able to Concentrate , Alert , Goal Oriented , Linear Thinking , Insightful    Mood/Affect:  Animated, Happy , Interested    CSW Assessment:  CSW introduced self and role of CSW at the hospital. CSW shared reason for consult, and MOB expressed appreciation for someone inquiring about her mental health prior to discharge. She shared that she is feeling "great", and reported that she is excited and eager to be discharged home.  MOB was in a pleasant mood, displayed a full range in affect, no  symptoms of anxiety observed or noted in her thought process.  She endorsed strong support system from the FOB, and shared that her parents are in town visiting to help her with her other children. She continued to discuss her plans to enroll her other children in summer camps and programs to help occupy their time and provide her with opportunities to rest and provide more intense care to this infant.  MOB presented with acute awareness of importance of caring for herself, and shared belief that "I'll be a better mother if I take care of myself".   Per MOB, onset of anxiety/panic attacks occurred March 2015.  She shared that she started to feel "weird" prior to and following presentations, and the FOB encouraged her to speak to her PCP. She shared that she was then notified that she was likely having panic attacks, and discussed that her PCP prescribed her Xanax PRN. MOB stated that her only trigger for anxiety is prior to giving presentations at work, and denied awareness of any other symptoms occuring at home or in her daily life.  CSW continued to provide support related to maternal anxiety, including providing her with various cognitive techniques that may assist her to self-regulate.    MOB denied mental health history, but expressed appreciation for need to closely monitoring her mood. She agreed to contact her medical provider if she notes symptoms in the postpartum period.   CSW Plan/Description:   1)Patient/Family Education: Postpartum depression and the baby blues 2)No Further Intervention Required/No Barriers to Discharge    Kristy Charles,  Monico BlitzSarah N, LCSW 02/05/2015, 11:53 AM

## 2015-02-07 LAB — TYPE AND SCREEN
ABO/RH(D): O NEG
ANTIBODY SCREEN: POSITIVE
DAT, IgG: NEGATIVE
Unit division: 0
Unit division: 0

## 2019-08-28 ENCOUNTER — Other Ambulatory Visit: Payer: Self-pay

## 2019-08-28 DIAGNOSIS — Z20822 Contact with and (suspected) exposure to covid-19: Secondary | ICD-10-CM

## 2019-08-30 LAB — NOVEL CORONAVIRUS, NAA: SARS-CoV-2, NAA: NOT DETECTED

## 2021-10-23 ENCOUNTER — Other Ambulatory Visit: Payer: Self-pay | Admitting: Obstetrics and Gynecology

## 2021-10-23 DIAGNOSIS — R928 Other abnormal and inconclusive findings on diagnostic imaging of breast: Secondary | ICD-10-CM

## 2021-11-11 ENCOUNTER — Other Ambulatory Visit: Payer: Self-pay | Admitting: Gastroenterology

## 2021-11-11 ENCOUNTER — Other Ambulatory Visit: Payer: Self-pay | Admitting: Physician Assistant

## 2021-11-11 DIAGNOSIS — K219 Gastro-esophageal reflux disease without esophagitis: Secondary | ICD-10-CM

## 2021-11-11 DIAGNOSIS — K259 Gastric ulcer, unspecified as acute or chronic, without hemorrhage or perforation: Secondary | ICD-10-CM | POA: Diagnosis not present

## 2021-11-11 DIAGNOSIS — R1011 Right upper quadrant pain: Secondary | ICD-10-CM

## 2021-11-11 DIAGNOSIS — K2930 Chronic superficial gastritis without bleeding: Secondary | ICD-10-CM | POA: Diagnosis not present

## 2021-11-14 ENCOUNTER — Ambulatory Visit
Admission: RE | Admit: 2021-11-14 | Discharge: 2021-11-14 | Disposition: A | Discharge status: Home / Self Care | Payer: Federal, State, Local not specified - PPO | Source: Ambulatory Visit | Attending: Physician Assistant | Admitting: Physician Assistant

## 2021-11-14 DIAGNOSIS — K760 Fatty (change of) liver, not elsewhere classified: Secondary | ICD-10-CM | POA: Diagnosis not present

## 2021-11-14 DIAGNOSIS — R1011 Right upper quadrant pain: Secondary | ICD-10-CM

## 2021-11-14 DIAGNOSIS — K219 Gastro-esophageal reflux disease without esophagitis: Secondary | ICD-10-CM

## 2021-11-21 ENCOUNTER — Ambulatory Visit
Admission: RE | Admit: 2021-11-21 | Discharge: 2021-11-21 | Disposition: A | Discharge status: Home / Self Care | Payer: Federal, State, Local not specified - PPO | Source: Ambulatory Visit | Attending: Obstetrics and Gynecology | Admitting: Obstetrics and Gynecology

## 2021-11-21 ENCOUNTER — Ambulatory Visit
Admission: RE | Admit: 2021-11-21 | Discharge: 2021-11-21 | Disposition: A | Discharge status: Home / Self Care | Payer: Managed Care, Other (non HMO) | Source: Ambulatory Visit | Attending: Obstetrics and Gynecology | Admitting: Obstetrics and Gynecology

## 2021-11-21 ENCOUNTER — Other Ambulatory Visit: Payer: Self-pay

## 2021-11-21 DIAGNOSIS — R922 Inconclusive mammogram: Secondary | ICD-10-CM | POA: Diagnosis not present

## 2021-11-21 DIAGNOSIS — R928 Other abnormal and inconclusive findings on diagnostic imaging of breast: Secondary | ICD-10-CM

## 2021-11-24 ENCOUNTER — Other Ambulatory Visit: Payer: Managed Care, Other (non HMO)

## 2021-12-10 DIAGNOSIS — K589 Irritable bowel syndrome without diarrhea: Secondary | ICD-10-CM | POA: Diagnosis not present

## 2021-12-10 DIAGNOSIS — K219 Gastro-esophageal reflux disease without esophagitis: Secondary | ICD-10-CM | POA: Diagnosis not present

## 2021-12-10 DIAGNOSIS — K2930 Chronic superficial gastritis without bleeding: Secondary | ICD-10-CM | POA: Diagnosis not present

## 2022-05-20 DIAGNOSIS — E781 Pure hyperglyceridemia: Secondary | ICD-10-CM | POA: Diagnosis not present

## 2022-05-20 DIAGNOSIS — Z0000 Encounter for general adult medical examination without abnormal findings: Secondary | ICD-10-CM | POA: Diagnosis not present

## 2022-05-20 DIAGNOSIS — Z131 Encounter for screening for diabetes mellitus: Secondary | ICD-10-CM | POA: Diagnosis not present

## 2022-07-10 DIAGNOSIS — M545 Low back pain, unspecified: Secondary | ICD-10-CM | POA: Diagnosis not present

## 2022-07-10 DIAGNOSIS — N946 Dysmenorrhea, unspecified: Secondary | ICD-10-CM | POA: Diagnosis not present

## 2022-08-19 ENCOUNTER — Ambulatory Visit: Payer: Federal, State, Local not specified - PPO | Admitting: Family Medicine

## 2022-08-31 DIAGNOSIS — M545 Low back pain, unspecified: Secondary | ICD-10-CM | POA: Diagnosis not present

## 2022-08-31 DIAGNOSIS — N946 Dysmenorrhea, unspecified: Secondary | ICD-10-CM | POA: Diagnosis not present

## 2022-08-31 DIAGNOSIS — R102 Pelvic and perineal pain: Secondary | ICD-10-CM | POA: Diagnosis not present

## 2022-09-09 DIAGNOSIS — N8000 Endometriosis of the uterus, unspecified: Secondary | ICD-10-CM | POA: Diagnosis not present

## 2022-09-09 DIAGNOSIS — N80329 Endometriosis of the posterior cul-de-sac, unspecified depth: Secondary | ICD-10-CM | POA: Diagnosis not present

## 2022-09-09 DIAGNOSIS — N803C2 Endometriosis of the left uterosacral ligament, unspecified depth: Secondary | ICD-10-CM | POA: Diagnosis not present

## 2022-10-08 DIAGNOSIS — E781 Pure hyperglyceridemia: Secondary | ICD-10-CM | POA: Diagnosis not present

## 2022-10-12 DIAGNOSIS — L578 Other skin changes due to chronic exposure to nonionizing radiation: Secondary | ICD-10-CM | POA: Diagnosis not present

## 2022-10-12 DIAGNOSIS — L820 Inflamed seborrheic keratosis: Secondary | ICD-10-CM | POA: Diagnosis not present

## 2022-10-12 DIAGNOSIS — L814 Other melanin hyperpigmentation: Secondary | ICD-10-CM | POA: Diagnosis not present

## 2022-10-12 DIAGNOSIS — L821 Other seborrheic keratosis: Secondary | ICD-10-CM | POA: Diagnosis not present

## 2022-10-12 DIAGNOSIS — D1801 Hemangioma of skin and subcutaneous tissue: Secondary | ICD-10-CM | POA: Diagnosis not present

## 2022-12-24 DIAGNOSIS — L723 Sebaceous cyst: Secondary | ICD-10-CM | POA: Diagnosis not present

## 2023-01-24 IMAGING — US US BREAST*R* LIMITED INC AXILLA
1 series · 12 of 12 positions shown · non-contrast
Comparison: Previous exam(s).

CLINICAL DATA: The patient was called back for bilateral breast
masses

EXAM:
DIGITAL DIAGNOSTIC BILATERAL MAMMOGRAM WITH IMPLANTS, CAD AND
TOMOSYNTHESIS; ULTRASOUND RIGHT BREAST LIMITED; ULTRASOUND LEFT
BREAST LIMITED
TECHNIQUE: Bilateral digital diagnostic mammography and breast tomosynthesis
was performed. The images were evaluated with computer-aided
detection. Standard and/or implant displaced views were performed.;
Targeted ultrasound examination of the right breast was performed;
Targeted ultrasound examination of the left breast was performed.

[Series 1: us breast*right* limited inc axilla · 0.06mm/px · 12 of 12 slices shown]
[im 1/12]
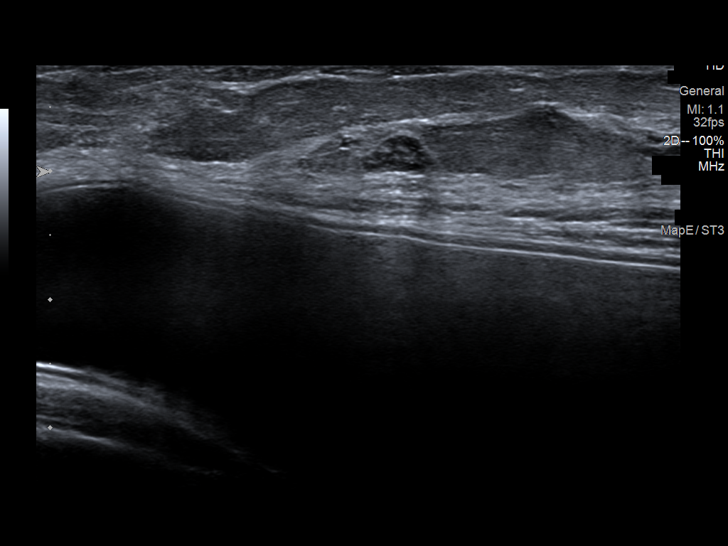
[im 2/12]
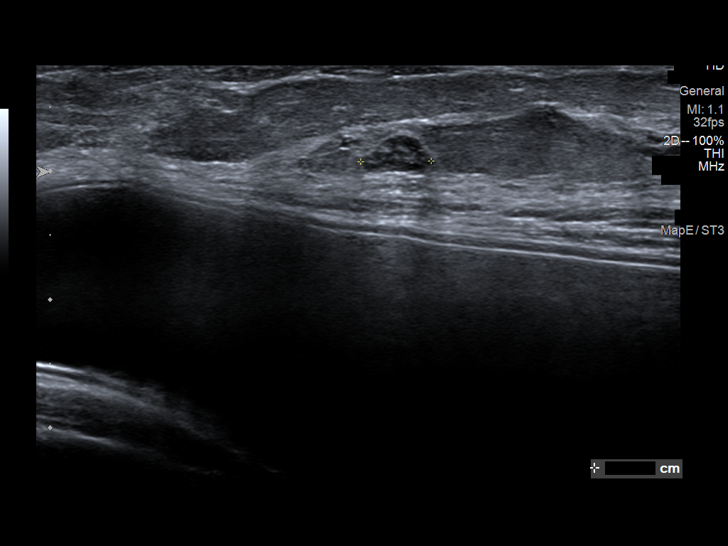
[im 3/12]
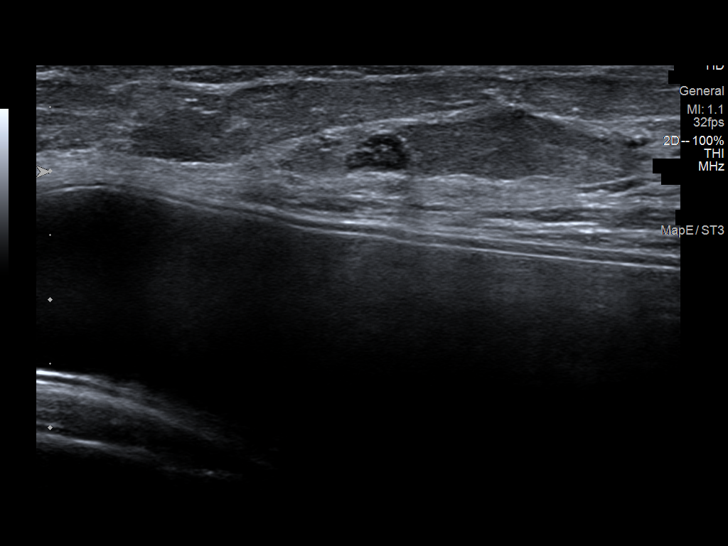
[im 4/12]
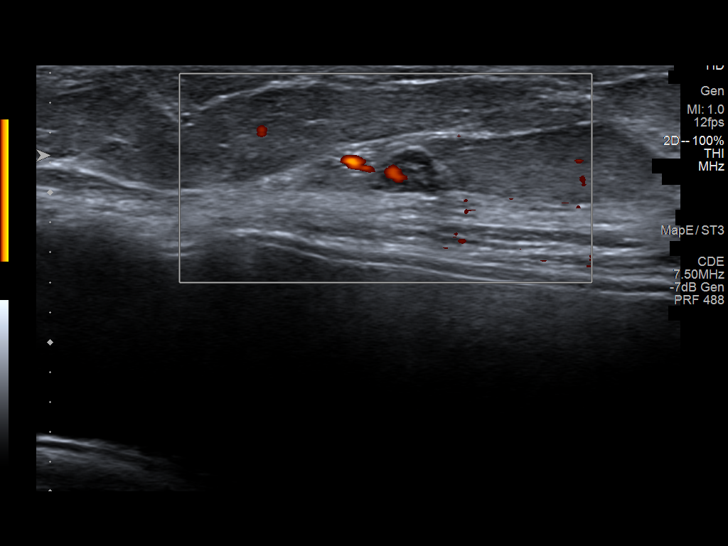
[im 5/12]
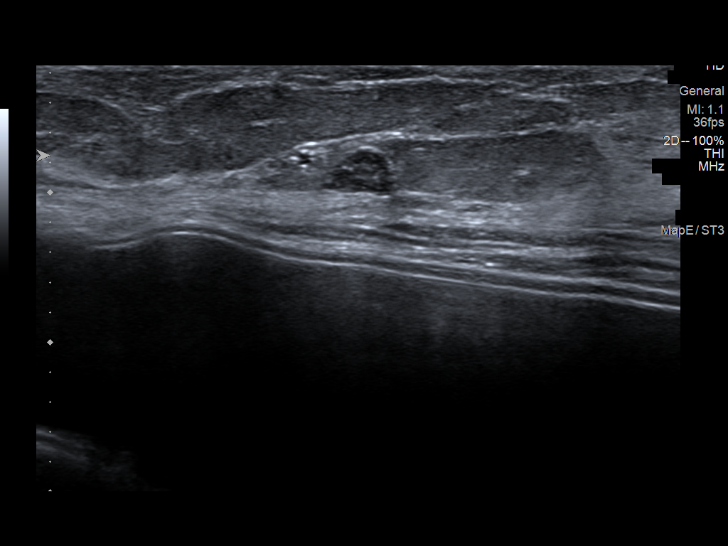
[im 6/12]
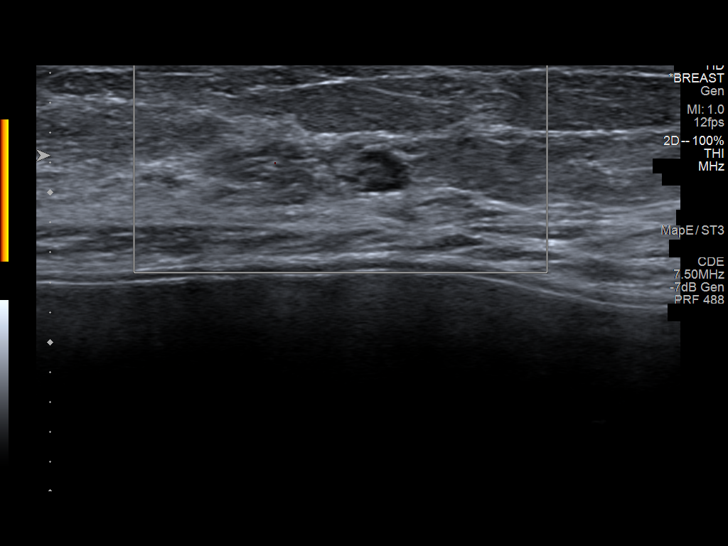
[im 7/12]
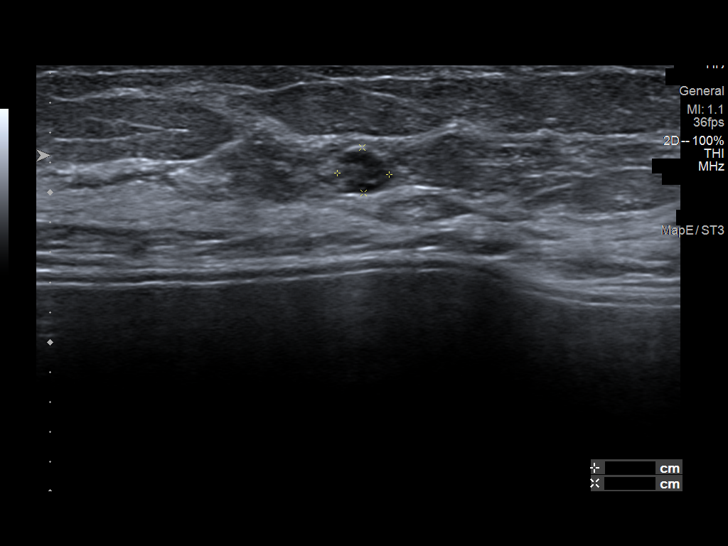
[im 8/12]
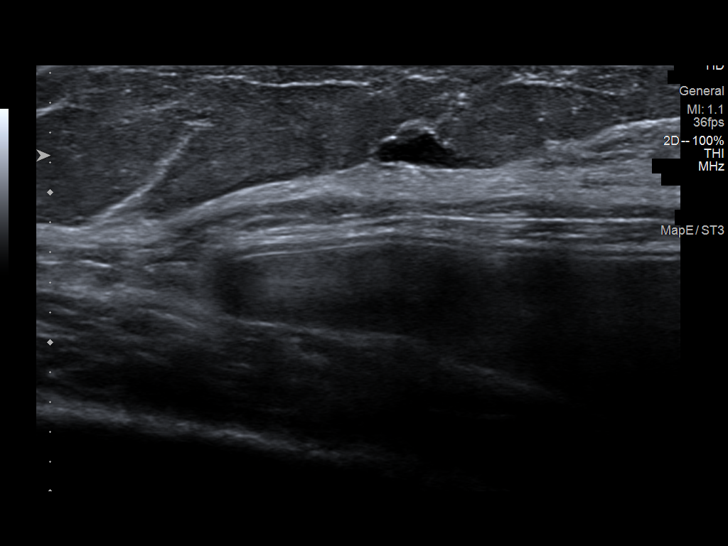
[im 9/12]
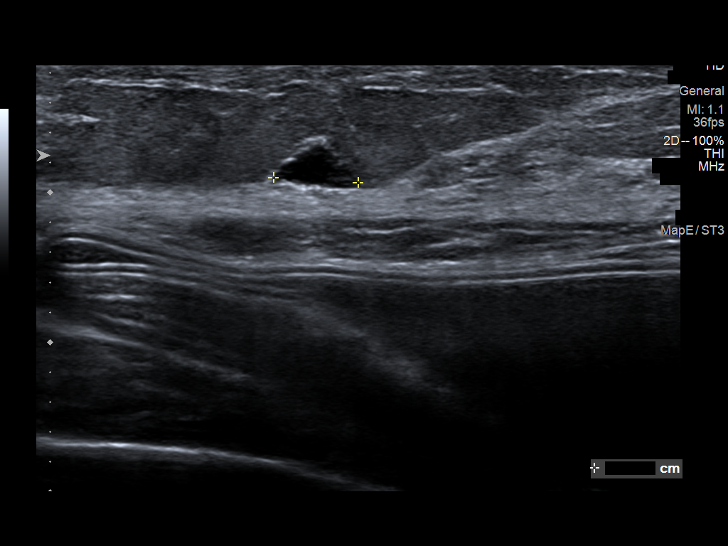
[im 10/12]
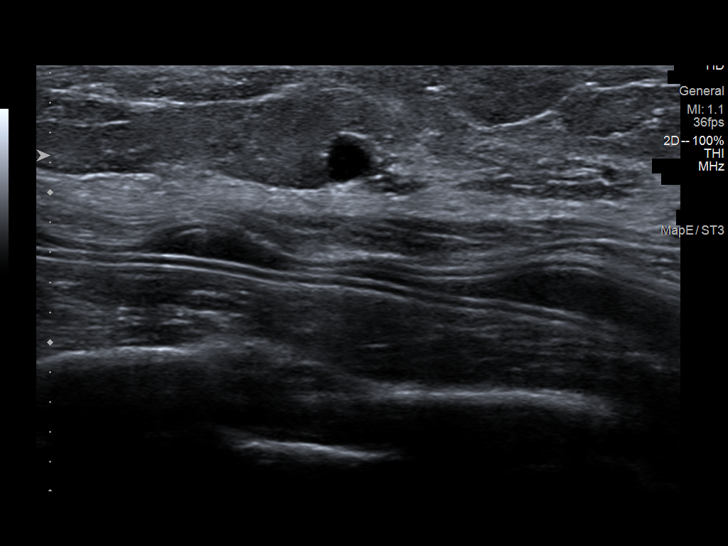
[im 11/12]
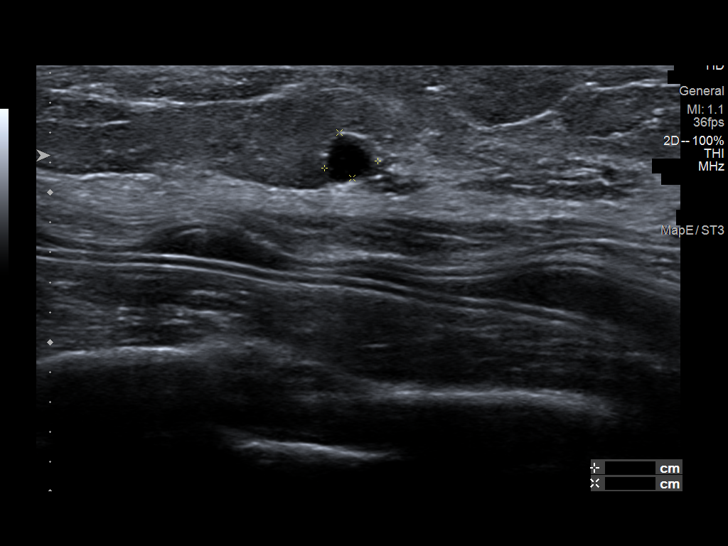
[im 12/12]
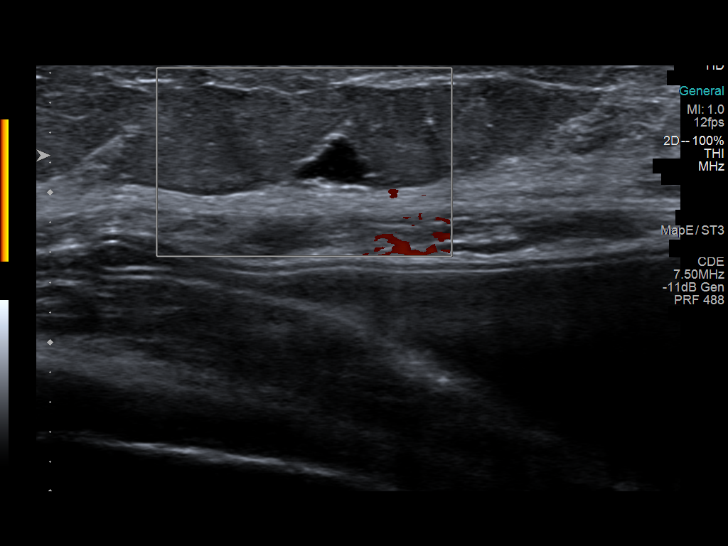

[12 of 12 positions shown; findings below may reference images not displayed]

ACR Breast Density Category c: The breast tissue is heterogeneously
dense, which may obscure small masses.
FINDINGS: The 2 masses in the lateral right breast persist. The mass in the
medial left breast is not seen on today's imaging. The patient has
retropectoral implants.

Targeted ultrasound is performed, showing a lymph node in a simple
cyst in the lateral right breast accounting for the mammographic
findings. No abnormalities identified in the medial left breast.
IMPRESSION: No mammographic or sonographic evidence of malignancy.

RECOMMENDATION:
Annual screening mammography.

I have discussed the findings and recommendations with the patient.
If applicable, a reminder letter will be sent to the patient
regarding the next appointment.

BI-RADS CATEGORY  2: Benign.

## 2023-03-16 DIAGNOSIS — Z01419 Encounter for gynecological examination (general) (routine) without abnormal findings: Secondary | ICD-10-CM | POA: Diagnosis not present

## 2023-03-16 DIAGNOSIS — Z1231 Encounter for screening mammogram for malignant neoplasm of breast: Secondary | ICD-10-CM | POA: Diagnosis not present

## 2023-03-18 DIAGNOSIS — F411 Generalized anxiety disorder: Secondary | ICD-10-CM | POA: Diagnosis not present

## 2023-04-07 DIAGNOSIS — Z1211 Encounter for screening for malignant neoplasm of colon: Secondary | ICD-10-CM | POA: Diagnosis not present

## 2023-04-07 DIAGNOSIS — K648 Other hemorrhoids: Secondary | ICD-10-CM | POA: Diagnosis not present

## 2023-06-15 DIAGNOSIS — L237 Allergic contact dermatitis due to plants, except food: Secondary | ICD-10-CM | POA: Diagnosis not present

## 2023-10-13 DIAGNOSIS — L821 Other seborrheic keratosis: Secondary | ICD-10-CM | POA: Diagnosis not present

## 2023-10-13 DIAGNOSIS — L578 Other skin changes due to chronic exposure to nonionizing radiation: Secondary | ICD-10-CM | POA: Diagnosis not present

## 2023-10-13 DIAGNOSIS — L814 Other melanin hyperpigmentation: Secondary | ICD-10-CM | POA: Diagnosis not present

## 2023-10-13 DIAGNOSIS — D229 Melanocytic nevi, unspecified: Secondary | ICD-10-CM | POA: Diagnosis not present

## 2023-10-14 DIAGNOSIS — E781 Pure hyperglyceridemia: Secondary | ICD-10-CM | POA: Diagnosis not present

## 2023-10-14 DIAGNOSIS — Z131 Encounter for screening for diabetes mellitus: Secondary | ICD-10-CM | POA: Diagnosis not present

## 2023-10-14 DIAGNOSIS — Z0000 Encounter for general adult medical examination without abnormal findings: Secondary | ICD-10-CM | POA: Diagnosis not present

## 2023-12-22 DIAGNOSIS — Z8249 Family history of ischemic heart disease and other diseases of the circulatory system: Secondary | ICD-10-CM | POA: Diagnosis not present

## 2023-12-22 DIAGNOSIS — R0681 Apnea, not elsewhere classified: Secondary | ICD-10-CM | POA: Diagnosis not present

## 2023-12-22 DIAGNOSIS — E781 Pure hyperglyceridemia: Secondary | ICD-10-CM | POA: Diagnosis not present

## 2023-12-22 DIAGNOSIS — R0683 Snoring: Secondary | ICD-10-CM | POA: Diagnosis not present

## 2024-01-26 DIAGNOSIS — G4733 Obstructive sleep apnea (adult) (pediatric): Secondary | ICD-10-CM | POA: Diagnosis not present

## 2024-01-28 DIAGNOSIS — G4733 Obstructive sleep apnea (adult) (pediatric): Secondary | ICD-10-CM | POA: Diagnosis not present

## 2024-03-02 DIAGNOSIS — G4733 Obstructive sleep apnea (adult) (pediatric): Secondary | ICD-10-CM | POA: Diagnosis not present

## 2024-05-01 DIAGNOSIS — G4733 Obstructive sleep apnea (adult) (pediatric): Secondary | ICD-10-CM | POA: Diagnosis not present

## 2024-05-11 DIAGNOSIS — K080 Exfoliation of teeth due to systemic causes: Secondary | ICD-10-CM | POA: Diagnosis not present

## 2024-05-16 DIAGNOSIS — N951 Menopausal and female climacteric states: Secondary | ICD-10-CM | POA: Diagnosis not present

## 2024-05-16 DIAGNOSIS — M7711 Lateral epicondylitis, right elbow: Secondary | ICD-10-CM | POA: Diagnosis not present

## 2024-05-16 DIAGNOSIS — Z6825 Body mass index (BMI) 25.0-25.9, adult: Secondary | ICD-10-CM | POA: Diagnosis not present

## 2024-05-16 DIAGNOSIS — F419 Anxiety disorder, unspecified: Secondary | ICD-10-CM | POA: Diagnosis not present

## 2024-05-30 DIAGNOSIS — Z1231 Encounter for screening mammogram for malignant neoplasm of breast: Secondary | ICD-10-CM | POA: Diagnosis not present

## 2024-05-30 DIAGNOSIS — Z01419 Encounter for gynecological examination (general) (routine) without abnormal findings: Secondary | ICD-10-CM | POA: Diagnosis not present

## 2024-06-01 DIAGNOSIS — G4733 Obstructive sleep apnea (adult) (pediatric): Secondary | ICD-10-CM | POA: Diagnosis not present

## 2024-06-07 DIAGNOSIS — Z8249 Family history of ischemic heart disease and other diseases of the circulatory system: Secondary | ICD-10-CM | POA: Diagnosis not present

## 2024-06-30 DIAGNOSIS — F411 Generalized anxiety disorder: Secondary | ICD-10-CM | POA: Diagnosis not present

## 2024-07-03 DIAGNOSIS — M7711 Lateral epicondylitis, right elbow: Secondary | ICD-10-CM | POA: Diagnosis not present

## 2024-07-03 DIAGNOSIS — M7541 Impingement syndrome of right shoulder: Secondary | ICD-10-CM | POA: Diagnosis not present

## 2024-07-03 DIAGNOSIS — M79601 Pain in right arm: Secondary | ICD-10-CM | POA: Diagnosis not present

## 2024-07-14 DIAGNOSIS — F411 Generalized anxiety disorder: Secondary | ICD-10-CM | POA: Diagnosis not present

## 2024-07-19 DIAGNOSIS — Z6825 Body mass index (BMI) 25.0-25.9, adult: Secondary | ICD-10-CM | POA: Diagnosis not present

## 2024-07-19 DIAGNOSIS — Z713 Dietary counseling and surveillance: Secondary | ICD-10-CM | POA: Diagnosis not present

## 2024-07-28 DIAGNOSIS — F411 Generalized anxiety disorder: Secondary | ICD-10-CM | POA: Diagnosis not present

## 2024-08-01 DIAGNOSIS — G4733 Obstructive sleep apnea (adult) (pediatric): Secondary | ICD-10-CM | POA: Diagnosis not present

## 2024-08-11 DIAGNOSIS — F411 Generalized anxiety disorder: Secondary | ICD-10-CM | POA: Diagnosis not present

## 2024-08-22 DIAGNOSIS — G4733 Obstructive sleep apnea (adult) (pediatric): Secondary | ICD-10-CM | POA: Diagnosis not present

## 2024-08-31 DIAGNOSIS — G4733 Obstructive sleep apnea (adult) (pediatric): Secondary | ICD-10-CM | POA: Diagnosis not present
